# Patient Record
Sex: Female | Born: 2000 | Race: White | Hispanic: No | Marital: Single
Health system: Southern US, Community
[De-identification: ages and names within clinical notes are randomized; demographics above are authoritative.]

## PROBLEM LIST (undated history)

## (undated) ENCOUNTER — Emergency Department (HOSPITAL_COMMUNITY): Admission: EM | Payer: Self-pay | Source: Home / Self Care

## (undated) DIAGNOSIS — R220 Localized swelling, mass and lump, head: Secondary | ICD-10-CM

## (undated) DIAGNOSIS — K5900 Constipation, unspecified: Secondary | ICD-10-CM

## (undated) DIAGNOSIS — M5481 Occipital neuralgia: Secondary | ICD-10-CM

## (undated) DIAGNOSIS — T8859XA Other complications of anesthesia, initial encounter: Secondary | ICD-10-CM

## (undated) DIAGNOSIS — F32A Depression, unspecified: Secondary | ICD-10-CM

## (undated) DIAGNOSIS — R519 Headache, unspecified: Secondary | ICD-10-CM

## (undated) DIAGNOSIS — R569 Unspecified convulsions: Secondary | ICD-10-CM

## (undated) DIAGNOSIS — R55 Syncope and collapse: Secondary | ICD-10-CM

## (undated) DIAGNOSIS — F419 Anxiety disorder, unspecified: Secondary | ICD-10-CM

## (undated) DIAGNOSIS — Z8679 Personal history of other diseases of the circulatory system: Secondary | ICD-10-CM

## (undated) HISTORY — PX: ADENOIDECTOMY: SUR15

## (undated) HISTORY — DX: Depression, unspecified: F32.A

## (undated) HISTORY — DX: Unspecified convulsions: R56.9

## (undated) HISTORY — DX: Localized swelling, mass and lump, head: R22.0

## (undated) HISTORY — DX: Anxiety disorder, unspecified: F41.9

## (undated) HISTORY — PX: WISDOM TOOTH EXTRACTION: SHX21

## (undated) HISTORY — PX: TONSILLECTOMY AND ADENOIDECTOMY: SUR1326

## (undated) HISTORY — DX: Personal history of other diseases of the circulatory system: Z86.79

---
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
## 2021-01-04 ENCOUNTER — Encounter (INDEPENDENT_AMBULATORY_CARE_PROVIDER_SITE_OTHER): Payer: Self-pay | Admitting: Pediatric Gastroenterology

## 2021-01-05 ENCOUNTER — Encounter (INDEPENDENT_AMBULATORY_CARE_PROVIDER_SITE_OTHER): Payer: Self-pay | Admitting: Psychology

## 2021-01-27 ENCOUNTER — Ambulatory Visit (INDEPENDENT_AMBULATORY_CARE_PROVIDER_SITE_OTHER): Payer: Medicaid Other | Admitting: Psychology

## 2021-05-19 IMAGING — US US ABDOMEN COMPLETE
1 series · 13 of 25 positions shown · non-contrast
Comparison: January 04, 2020

CLINICAL DATA: Nausea vomiting for 2 months, lower abdominal pain

EXAM:
ABDOMEN ULTRASOUND COMPLETE

[Series 1: us abdomen complete · 0.20mm/px · 13 of 74 slices shown]
[im 1/74]
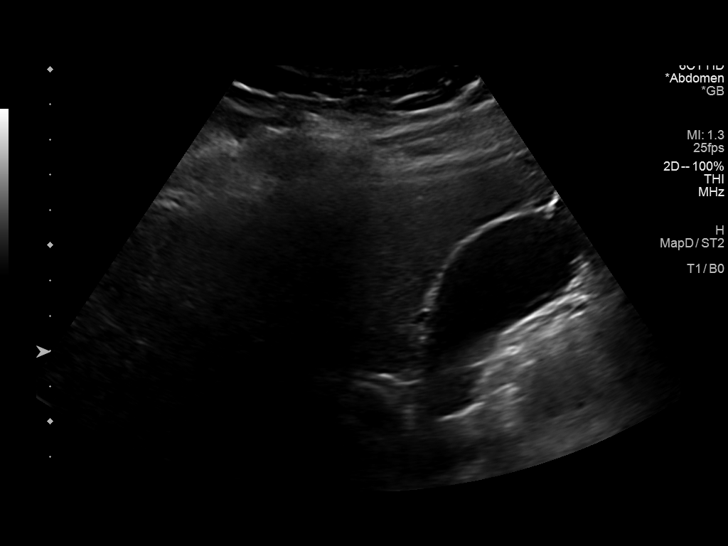
[im 7/74]
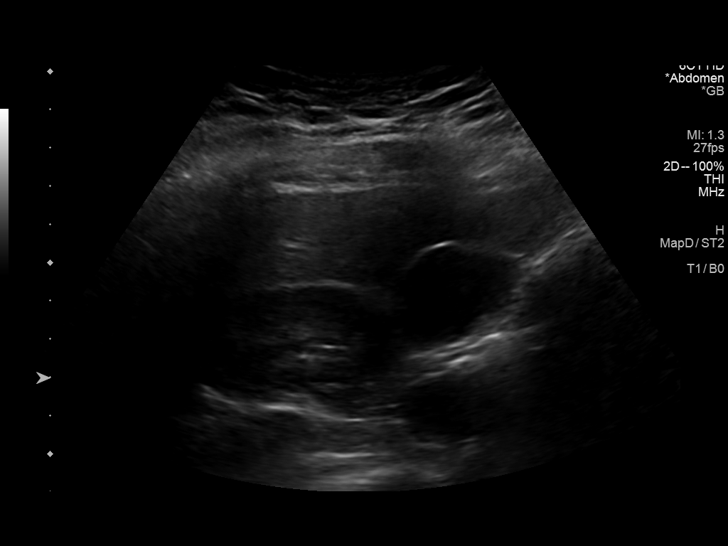
[im 13/74]
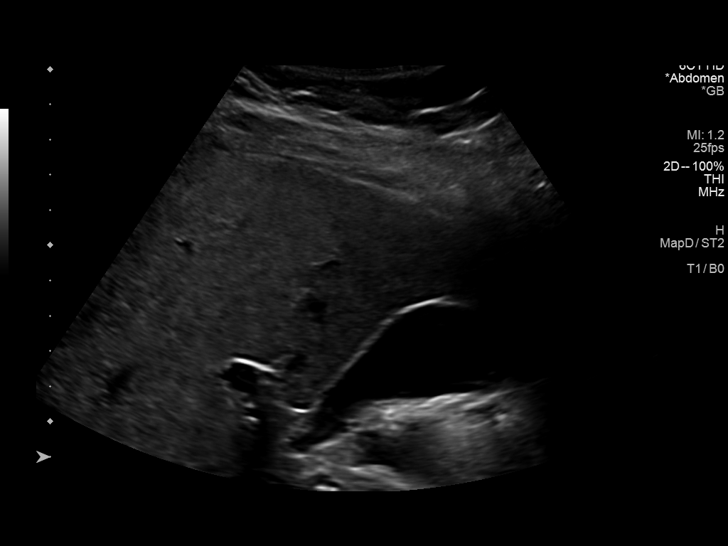
[im 19/74]
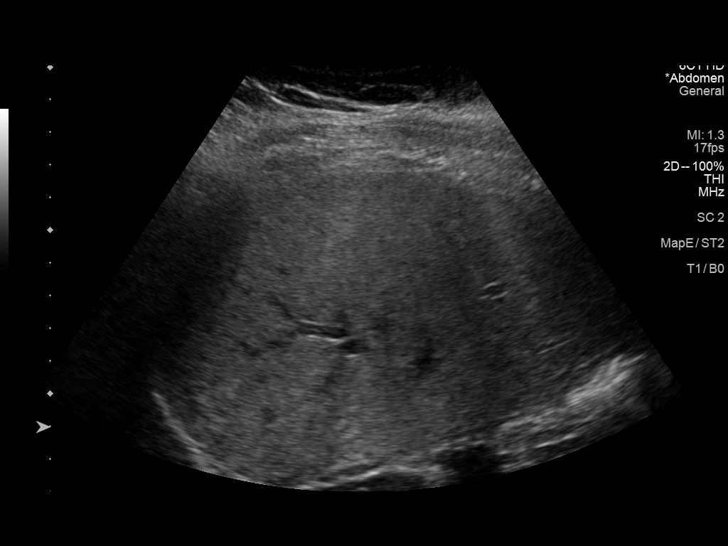
[im 25/74]
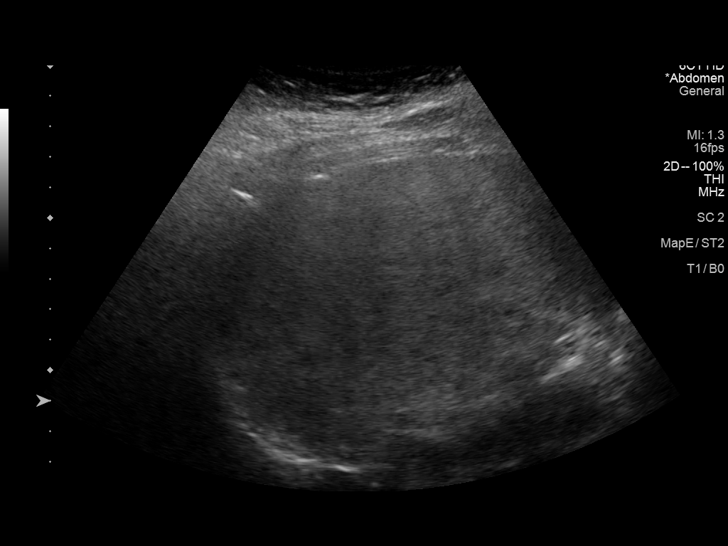
[im 31/74]
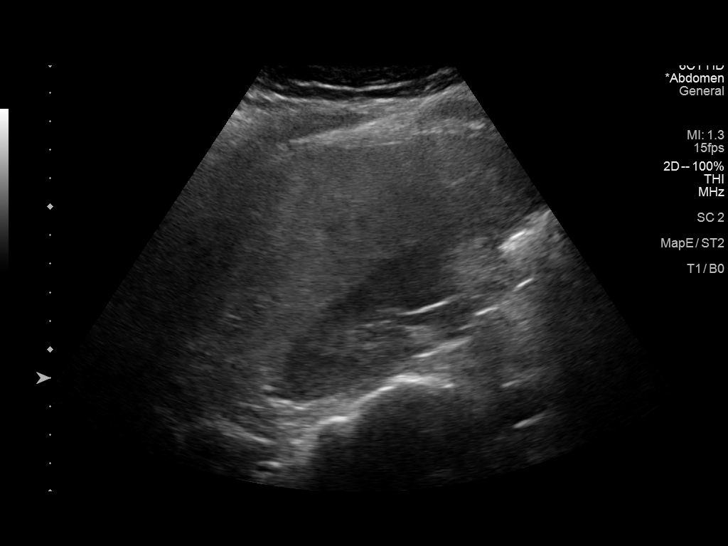
[im 37/74]
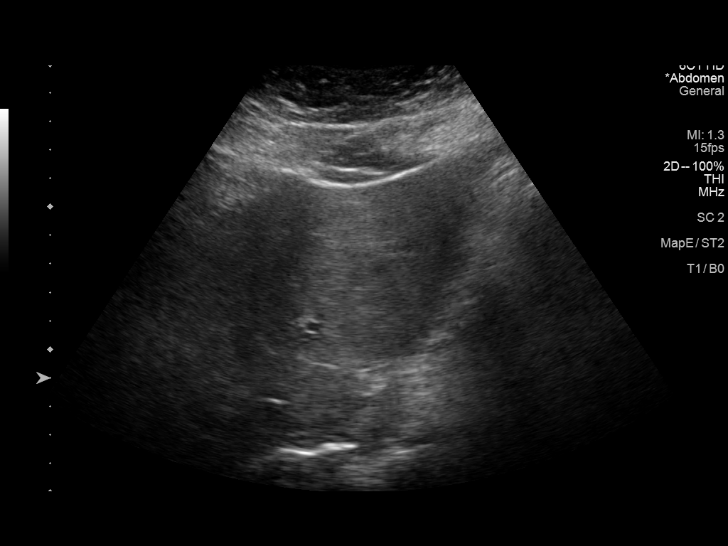
[im 43/74]
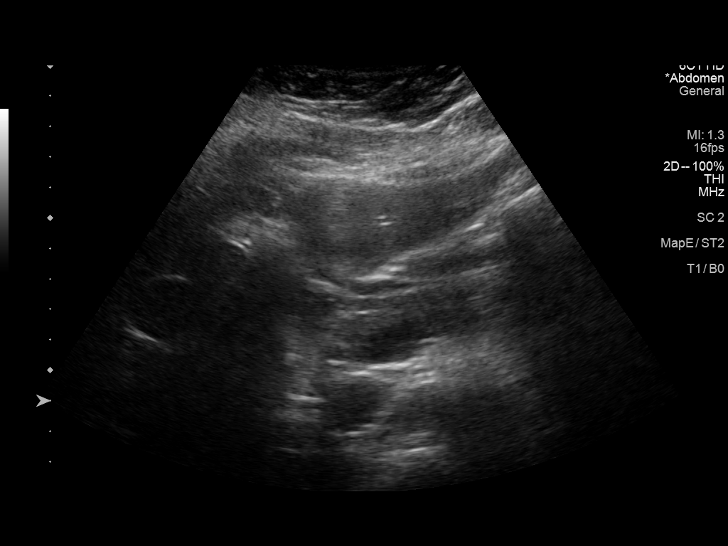
[im 49/74]
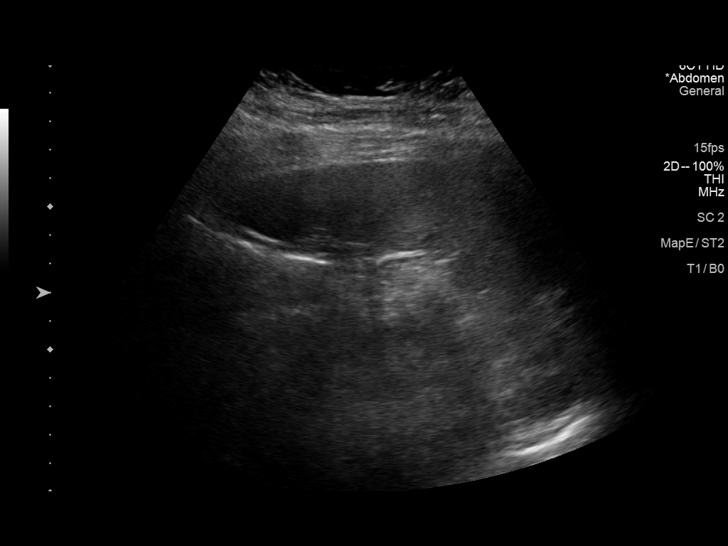
[im 55/74]
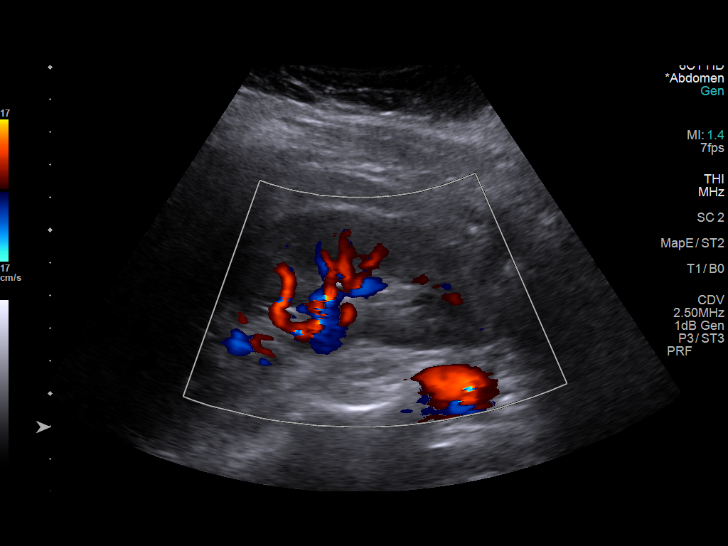
[im 61/74]
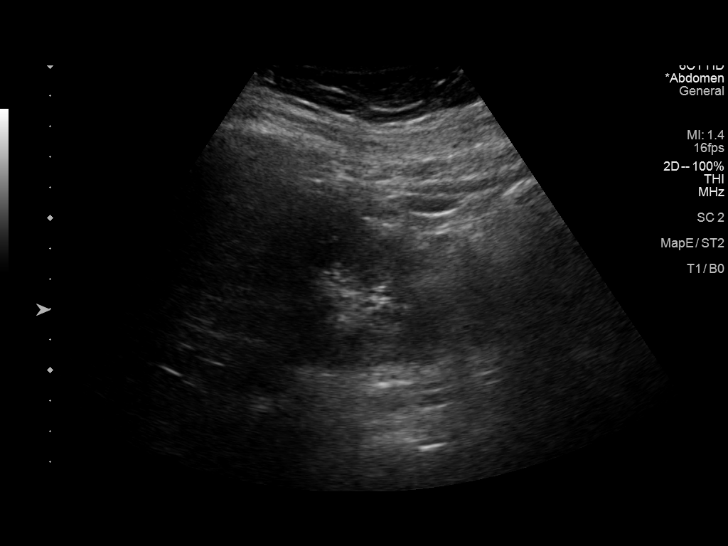
[im 67/74]
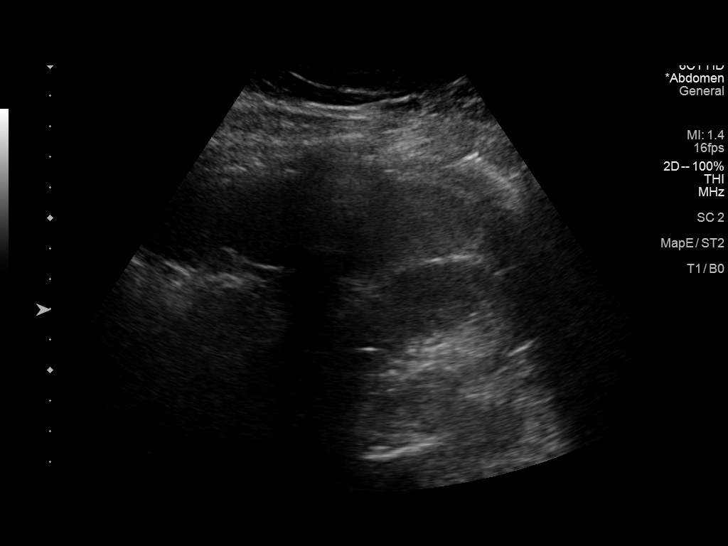
[im 74/74]
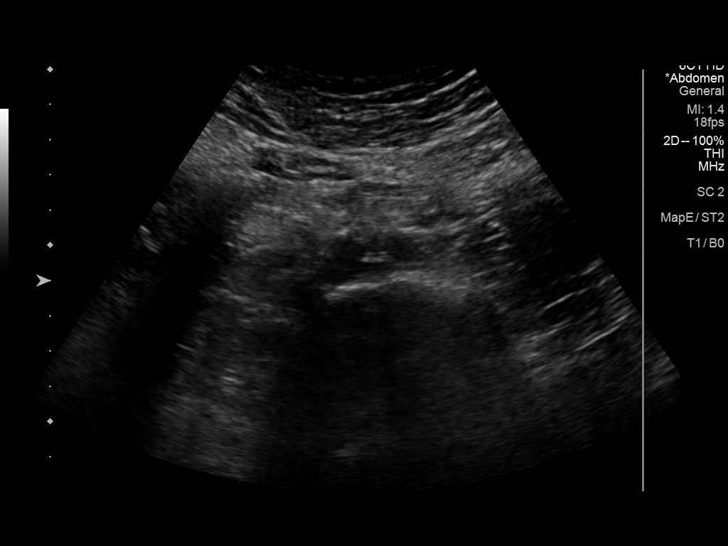

[13 of 25 positions shown; findings below may reference images not displayed]

FINDINGS: Gallbladder: No gallstones or wall thickening visualized. No
sonographic Murphy sign noted by sonographer.

Common bile duct: Diameter: 3.7 mm

Liver: In homogeneous increased echotexture of the liver appears
increased in both echogenicity and heterogeneity compared to the
recent abdominal sonogram portal vein is patent on color Doppler
imaging with normal direction of blood flow towards the liver.

IVC: No abnormality visualized.

Pancreas: Visualized portion unremarkable.

Spleen: 12.7 cm greatest craniocaudal span.

Right Kidney: Length: 13.1 cm. Echogenicity within normal limits. No
mass or hydronephrosis visualized.

Left Kidney: Length: 12.7 cm. Echogenicity within normal limits. No
mass or hydronephrosis visualized.

Abdominal aorta: No aneurysm visualized.

Other findings: No ascites
IMPRESSION: 1. Worsening hepatic steatosis with heterogeneous echotexture,
correlate with any clinical or laboratory evidence of liver
dysfunction.
2. Mild splenomegaly.

These results will be called to the ordering clinician or
representative by the Radiologist Assistant, and communication
documented in the PACS or [REDACTED].

## 2022-10-19 ENCOUNTER — Encounter: Payer: Self-pay | Admitting: *Deleted
